# Patient Record
Sex: Female | Born: 2000 | Race: Black or African American | Hispanic: No | Marital: Single | Smoking: Never smoker
Health system: Southern US, Community
[De-identification: ages and names within clinical notes are randomized; demographics above are authoritative.]

## PROBLEM LIST (undated history)

## (undated) DIAGNOSIS — F909 Attention-deficit hyperactivity disorder, unspecified type: Secondary | ICD-10-CM

---
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
## 2021-03-21 IMAGING — CT CT HEAD W/O CM
4 series · 17 of 47 positions shown, 19 images · non-contrast
Comparison: None.

CLINICAL DATA: Unresponsive.  Altered level of consciousness

EXAM:
CT HEAD WITHOUT CONTRAST
TECHNIQUE: Contiguous axial images were obtained from the base of the skull
through the vertex without intravenous contrast.

[Series 2: head wo · axial · 0.41mm/px · z∈[-88,+32]mm · 7 of 33 slices shown, 9 images]
[im 5/33  brain]
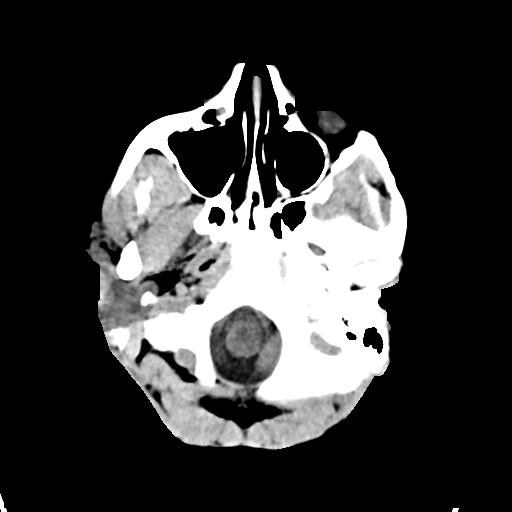
[im 5/33  bone]
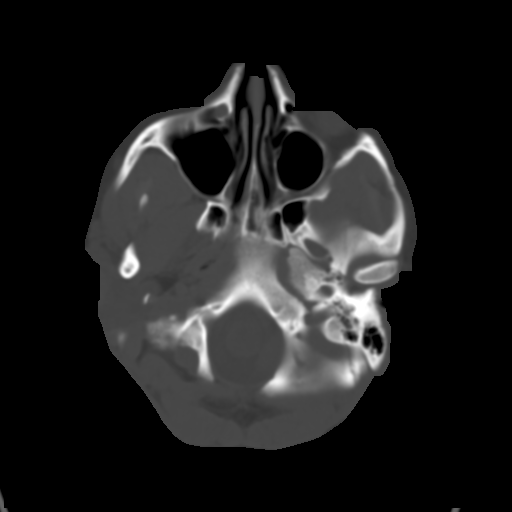
[im 9/33  brain]
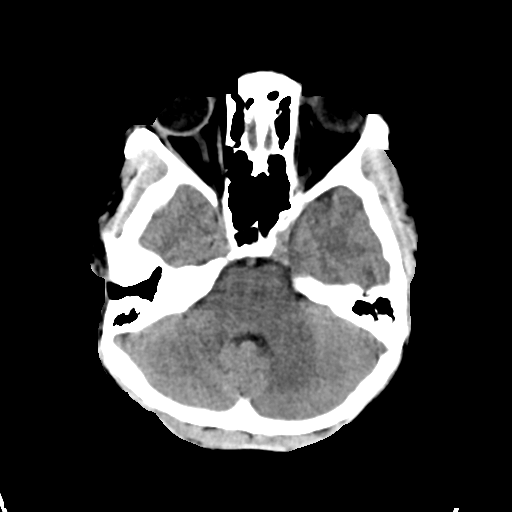
[im 13/33  brain]
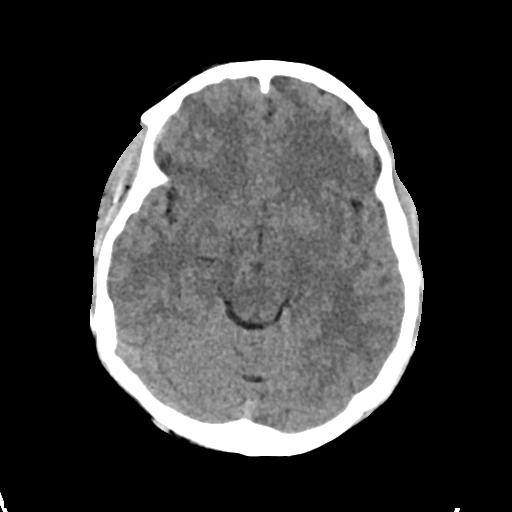
[im 17/33  brain]
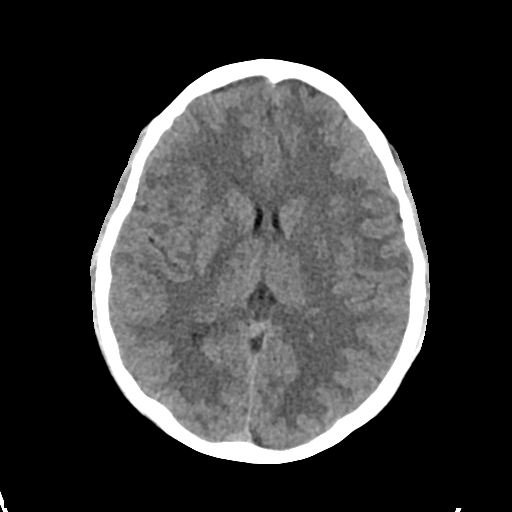
[im 21/33  brain]
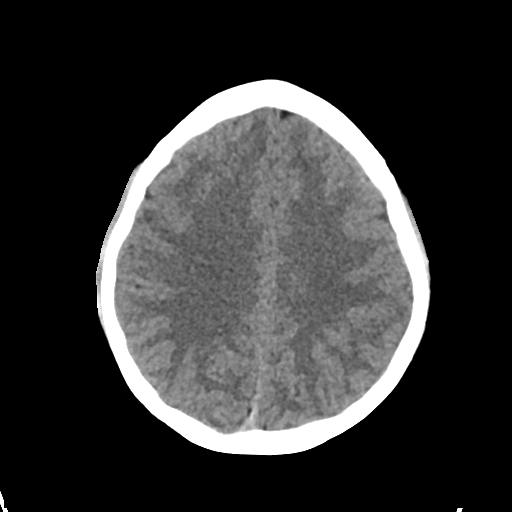
[im 21/33  bone]
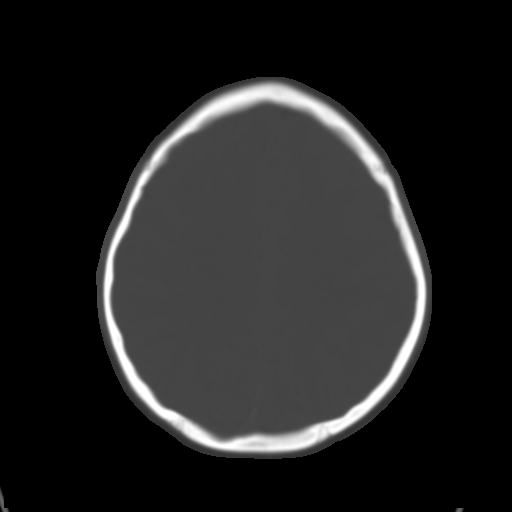
[im 25/33  brain]
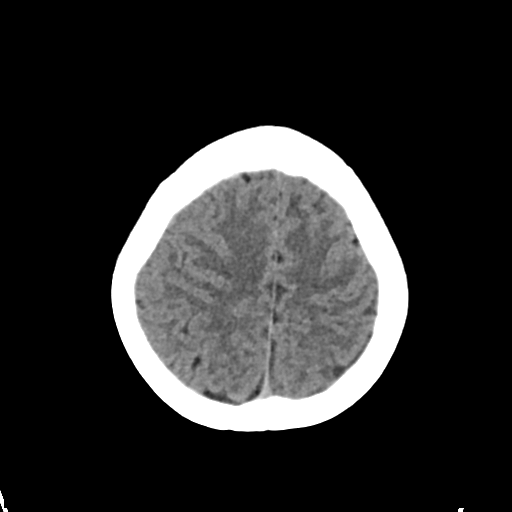
[im 29/33  brain]
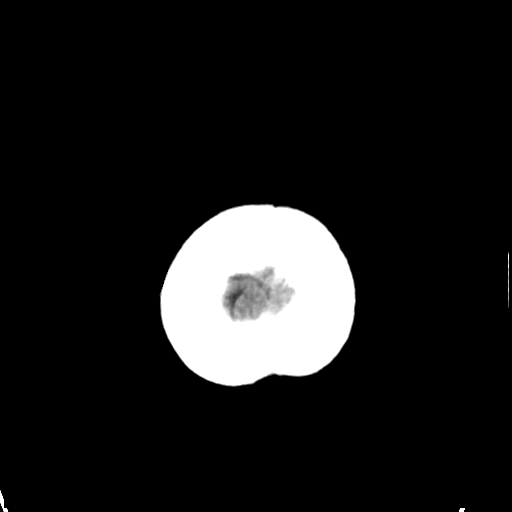

[Series 3: head bone · axial · 0.41mm/px · z∈[-92,-36]mm · 4 of 82 slices shown]
[im 9/82  bone]
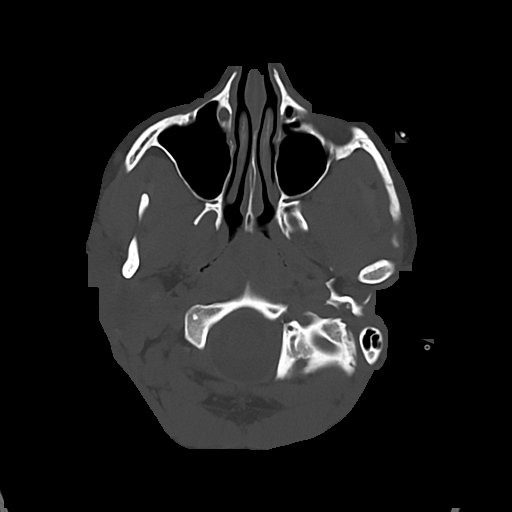
[im 17/82  bone]
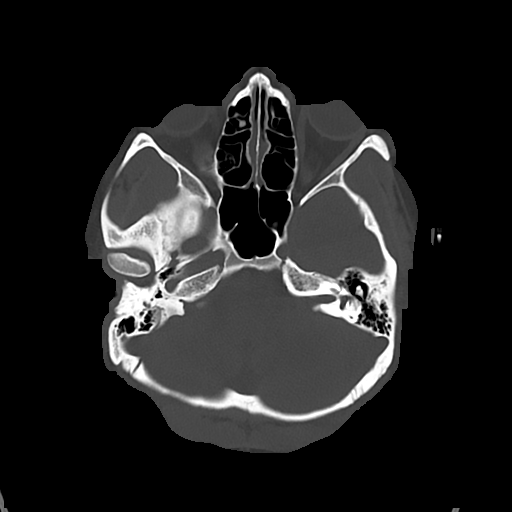
[im 25/82  bone]
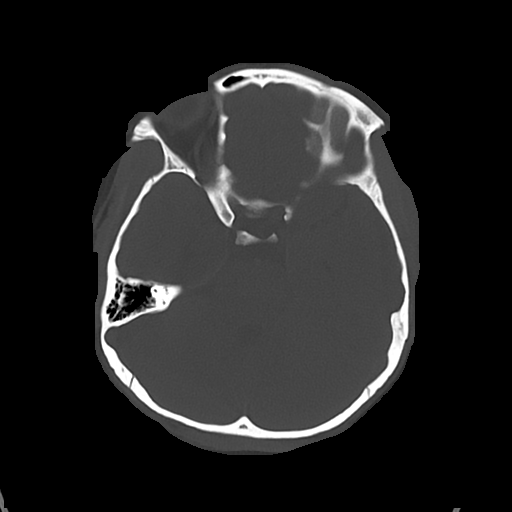
[im 37/82  bone]
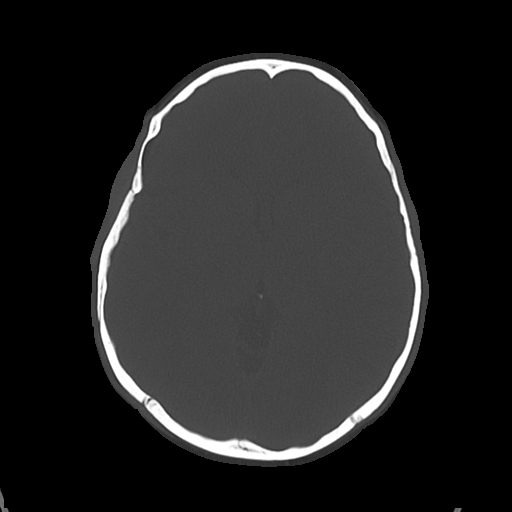

[Series 4: cor soft · coronal · 0.31mm/px · 3 of 66 slices shown]
[im 22/66  brain]
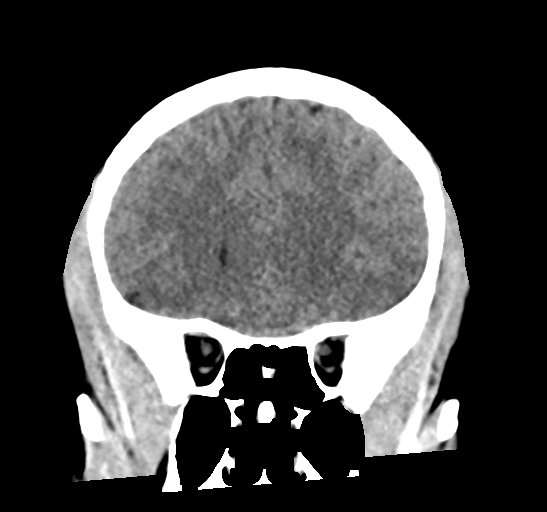
[im 29/66  brain]
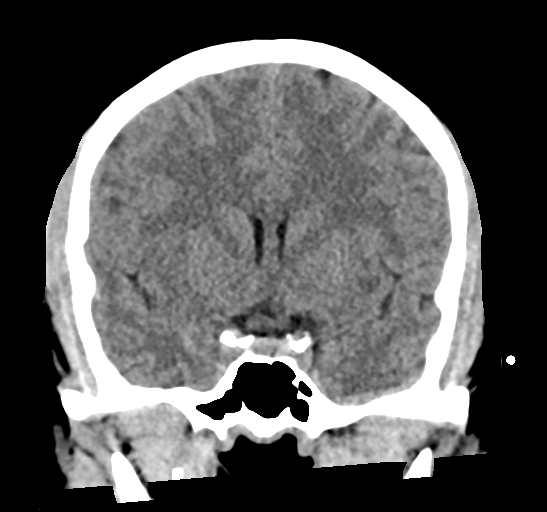
[im 37/66  brain]
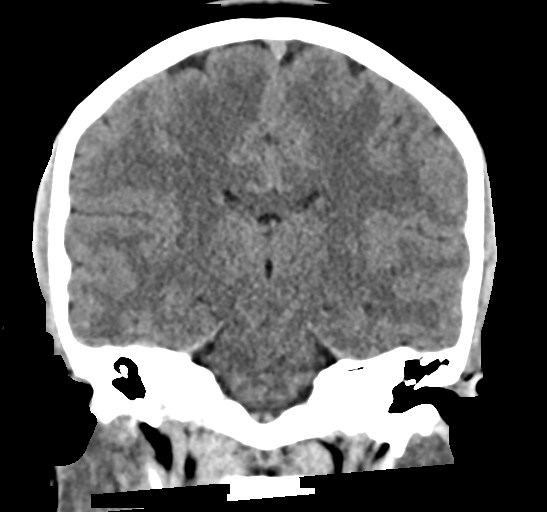

[Series 5: sag soft · sagittal · 0.31mm/px · 3 of 54 slices shown]
[im 18/54  brain]
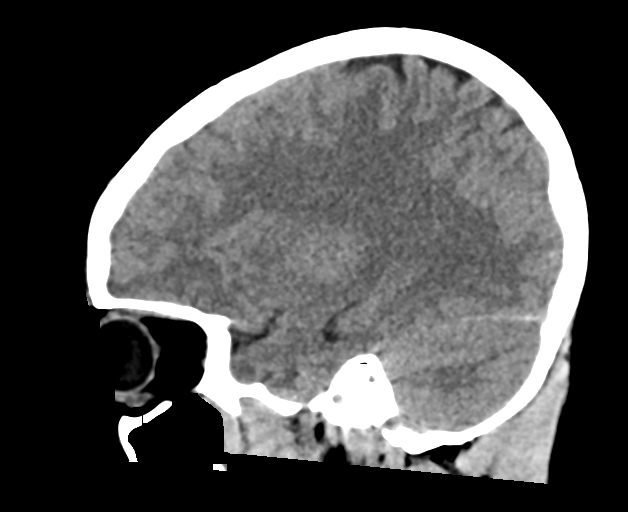
[im 27/54  brain]
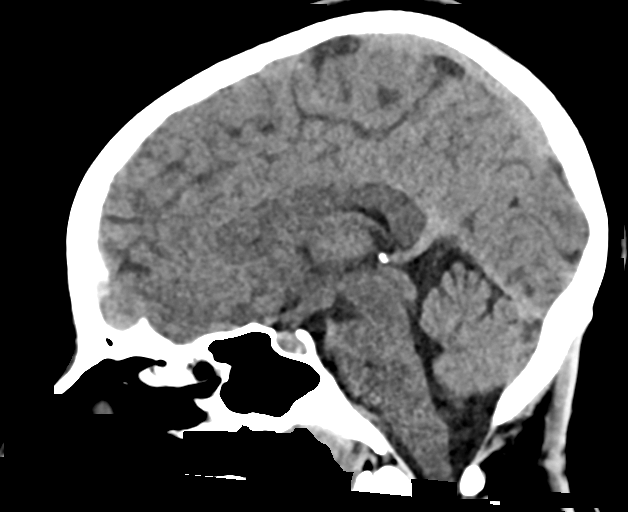
[im 36/54  brain]
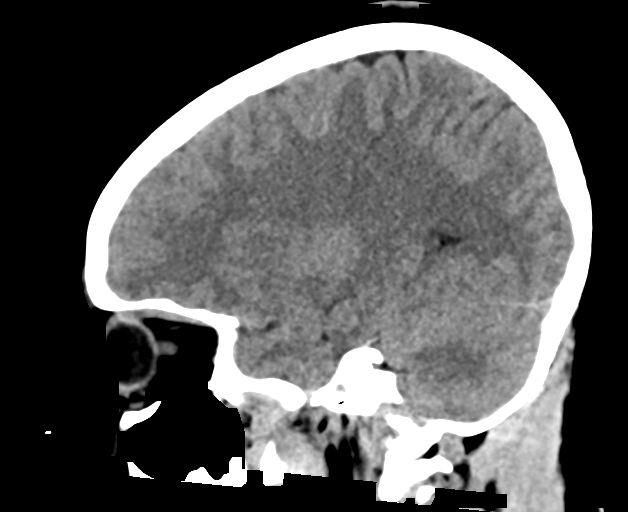

[17 of 47 positions shown; findings below may reference images not displayed]

FINDINGS: Brain: No acute intracranial abnormality. Specifically, no
hemorrhage, hydrocephalus, mass lesion, acute infarction, or
significant intracranial injury.

Vascular: No hyperdense vessel or unexpected calcification.

Skull: No acute calvarial abnormality.

Sinuses/Orbits: Visualized paranasal sinuses and mastoids clear.
Orbital soft tissues unremarkable.

Other: None
IMPRESSION: Normal study.
